# Patient Record
Sex: Male | Born: 1960
Health system: Southern US, Community
[De-identification: ages and names within clinical notes are randomized; demographics above are authoritative.]

## PROBLEM LIST (undated history)

## (undated) DIAGNOSIS — F32A Depression, unspecified: Secondary | ICD-10-CM

## (undated) DIAGNOSIS — I10 Essential (primary) hypertension: Secondary | ICD-10-CM

## (undated) DIAGNOSIS — K219 Gastro-esophageal reflux disease without esophagitis: Secondary | ICD-10-CM

## (undated) HISTORY — PX: COLONOSCOPY: SHX174

## (undated) HISTORY — PX: CHOLECYSTECTOMY: SHX55

## (undated) HISTORY — DX: Gastro-esophageal reflux disease without esophagitis: K21.9

## (undated) HISTORY — PX: UPPER GASTROINTESTINAL ENDOSCOPY: SHX188

## (undated) HISTORY — DX: Depression, unspecified: F32.A

## (undated) HISTORY — DX: Essential (primary) hypertension: I10

---

## 2007-09-21 ENCOUNTER — Emergency Department (HOSPITAL_COMMUNITY): Admission: EM | Admit: 2007-09-21 | Discharge: 2007-09-21 | Payer: Self-pay | Admitting: Emergency Medicine

## 2008-03-24 ENCOUNTER — Encounter (INDEPENDENT_AMBULATORY_CARE_PROVIDER_SITE_OTHER): Payer: Self-pay | Admitting: General Surgery

## 2008-03-24 ENCOUNTER — Ambulatory Visit (HOSPITAL_COMMUNITY): Admission: RE | Admit: 2008-03-24 | Discharge: 2008-03-25 | Payer: Self-pay | Admitting: General Surgery

## 2009-03-10 ENCOUNTER — Emergency Department (HOSPITAL_COMMUNITY): Admission: EM | Admit: 2009-03-10 | Discharge: 2009-03-10 | Payer: Self-pay | Admitting: Infectious Diseases

## 2009-07-09 ENCOUNTER — Encounter (HOSPITAL_COMMUNITY): Admission: RE | Admit: 2009-07-09 | Discharge: 2009-08-08 | Payer: Self-pay | Admitting: Preventative Medicine

## 2010-08-19 LAB — POCT CARDIAC MARKERS
CKMB, poc: 2 ng/mL (ref 1.0–8.0)
Myoglobin, poc: 82.3 ng/mL (ref 12–200)
Troponin i, poc: <0.05 ng/mL (ref 0.00–0.09)
Troponin i, poc: <0.05 ng/mL (ref 0.00–0.09)

## 2010-09-28 NOTE — Op Note (Signed)
Steve Rivera, Steve Rivera             ACCOUNT NO.:  [id number]   MEDICAL RECORD NO.:  [id number]          PATIENT TYPE:  AMB   LOCATION:  DAY                          FACILITY:  [facility]   PHYSICIAN:  Anselm Pancoast. Weatherly, M.D.DATE OF BIRTH:  07/25/1960   DATE OF PROCEDURE:  03/24/2008  DATE OF DISCHARGE:                               OPERATIVE REPORT   PREOPERATIVE DIAGNOSIS:  Biliary dyskinesia, possible chronic  cholecystitis.   POSTOPERATIVE DIAGNOSIS:  Biliary dyskinesia, possible chronic  cholecystitis.   OPERATION:  Laparoscopic cholecystectomy with cholangiogram.   SURGEON:  Anselm Pancoast. Zachery Dakins, M.D.   ASSISTANT:  Almond Lint, MD.   HISTORY:  Mattew Chriswell is a 50 year old thin male who was referred by  Dr. Arletha Grippe of [facility] for symptomatic gallstones.  The patient states he  has had recurrent episodes of epigastric pain.  The pain is made worse  by eating.  He has been evaluated with and upper endoscopy.  Past  history of ulcers, but no evidence of ulcers were demonstrated in the  duodenum on recent endoscopy.  He has had an ultrasound of his  gallbladder which they described as adenomatous polyps within the  gallbladder.  The common bile duct was not dilated and the gallbladder  wall was not thickened.  He has been on Prilosec.  He has had a history  of hypertension for which he is being managed and he was referred to me  for a cholecystectomy.  The patient looks younger than his stated age.  On review of the ultrasound there are small polyps and whether they are  actually polyps or small gallstones, I could not tell.  There was no  evidence of any other abnormalities noted and I recommended that we  proceed on with a cholecystectomy since he has certainly been evaluated  for evidence of peptic ulcers and no evidence of any ulcers identified.  Preoperatively today his CBC is unremarkable; white count 4100 and  hematocrit of 38.5.  Preoperatively he has PAS stockings.  He  was given  a dose of Unasyn and taken to the operative suite.   DESCRIPTION OF PROCEDURE:  Induction of general anesthesia endotracheal  tube, oral tube to the stomach and the abdomen was prepped with Betadine  solution, draped in sterile manner.  The patient's torso is kind of like  a child, he is only 145 pounds.  A small incision after draping was made  below the umbilicus.  The fascia picked up between two Kochers and very  carefully opened into the peritoneal cavity.  The gallbladder was  distended.  After the pursestring suture of O Vicryl and Hassan cannula  introduced and the upper 10 mL trocar was placed in the subxiphoid area  under direct vision after anesthetizing the fascia.  Dr. Donell Beers passed  the two 5 mm lateral trocars at the appropriate position.  The liver was  kind of low lying.  The gallbladder did have adhesions around it.  These  were carefully taken down with the hook electrocautery.  The proximal  portion of the gallbladder was identified.  You could see the cystic  artery and the cystic duct, I went ahead and doubly clipped the cystic  artery proximally, singly distally and divided it to get it out of way,  so I encompassed the cystic duct with a right-angle and a clip placed on  the junction of the cystic duct gallbladder.  A small opening was made  proximally.  Bile comes first and the [facility] catheter was introduced, held  in place with clip.  The x-ray was obtained and showed good prompt fill  of the extrahepatic filling system.  Good flow into the duodenum and no  evidence of any common duct stones.  The catheter was removed.  The  cystic duct was triply clipped and divided and then the gallbladder was  freed from its bed and placed in the EndoCatch bag.  At the completion  of taking the gallbladder down, the exposure was difficult.  It appears  that the patient was kind of getting light and we were losing our  pneumoperitoneum.  There was just a little bit of  bile spillage.  We  irrigated and aspirated, cauterized a little area of the bed where the  most distal portion of the gallbladder was partially intraluminal and  good hemostasis was obtained.  We then switched the camera to the upper  10 mL port, withdrew the gallbladder at the umbilicus within the  EndoCatch bag.  I later opened the gallbladder on the back table and it  was chronically thickened but I could not see any definite stones.  All  contents were sent to pathology for examination.  Next the extra figure-  of-eight of 0 Vicryl was placed in the fascia at the umbilicus and then  we anesthetized fascia at the umbilicus.  The CO2 was turned off after  the remaining irrigating fluid had been removed and the 5 mm ports had  been withdrawn under direct vision.  Carbon dioxide released.  The upper  10 mm trocar was withdrawn.  I did put a figure-of-eight in the fascia  at the subxiphoid so then you could see into the peritoneal cavity.  The  subcutaneous wounds were closed with 4-0 Monocryl.  Benzoin and Steri-  Strips on skin.  We will let the patient decide in recovery room whether  he wants to go home or spend the night.  I think he lives in [place] and  that decision will be his.           ______________________________  Anselm Pancoast. Zachery Dakins, M.D.     WJW/MEDQ  D:  03/24/2008  T:  03/24/2008  Job:  [id number]   cc:   Dr. Arletha Grippe, [place]

## 2010-10-18 IMAGING — CR DG CHEST 2V
2 series · 2 of 2 positions shown · non-contrast
Comparison: Portable chest 09/21/2007.

CLINICAL DATA: Preop respiratory film for patient with gallstones.

CHEST - 2 VIEW

[w chest pa]
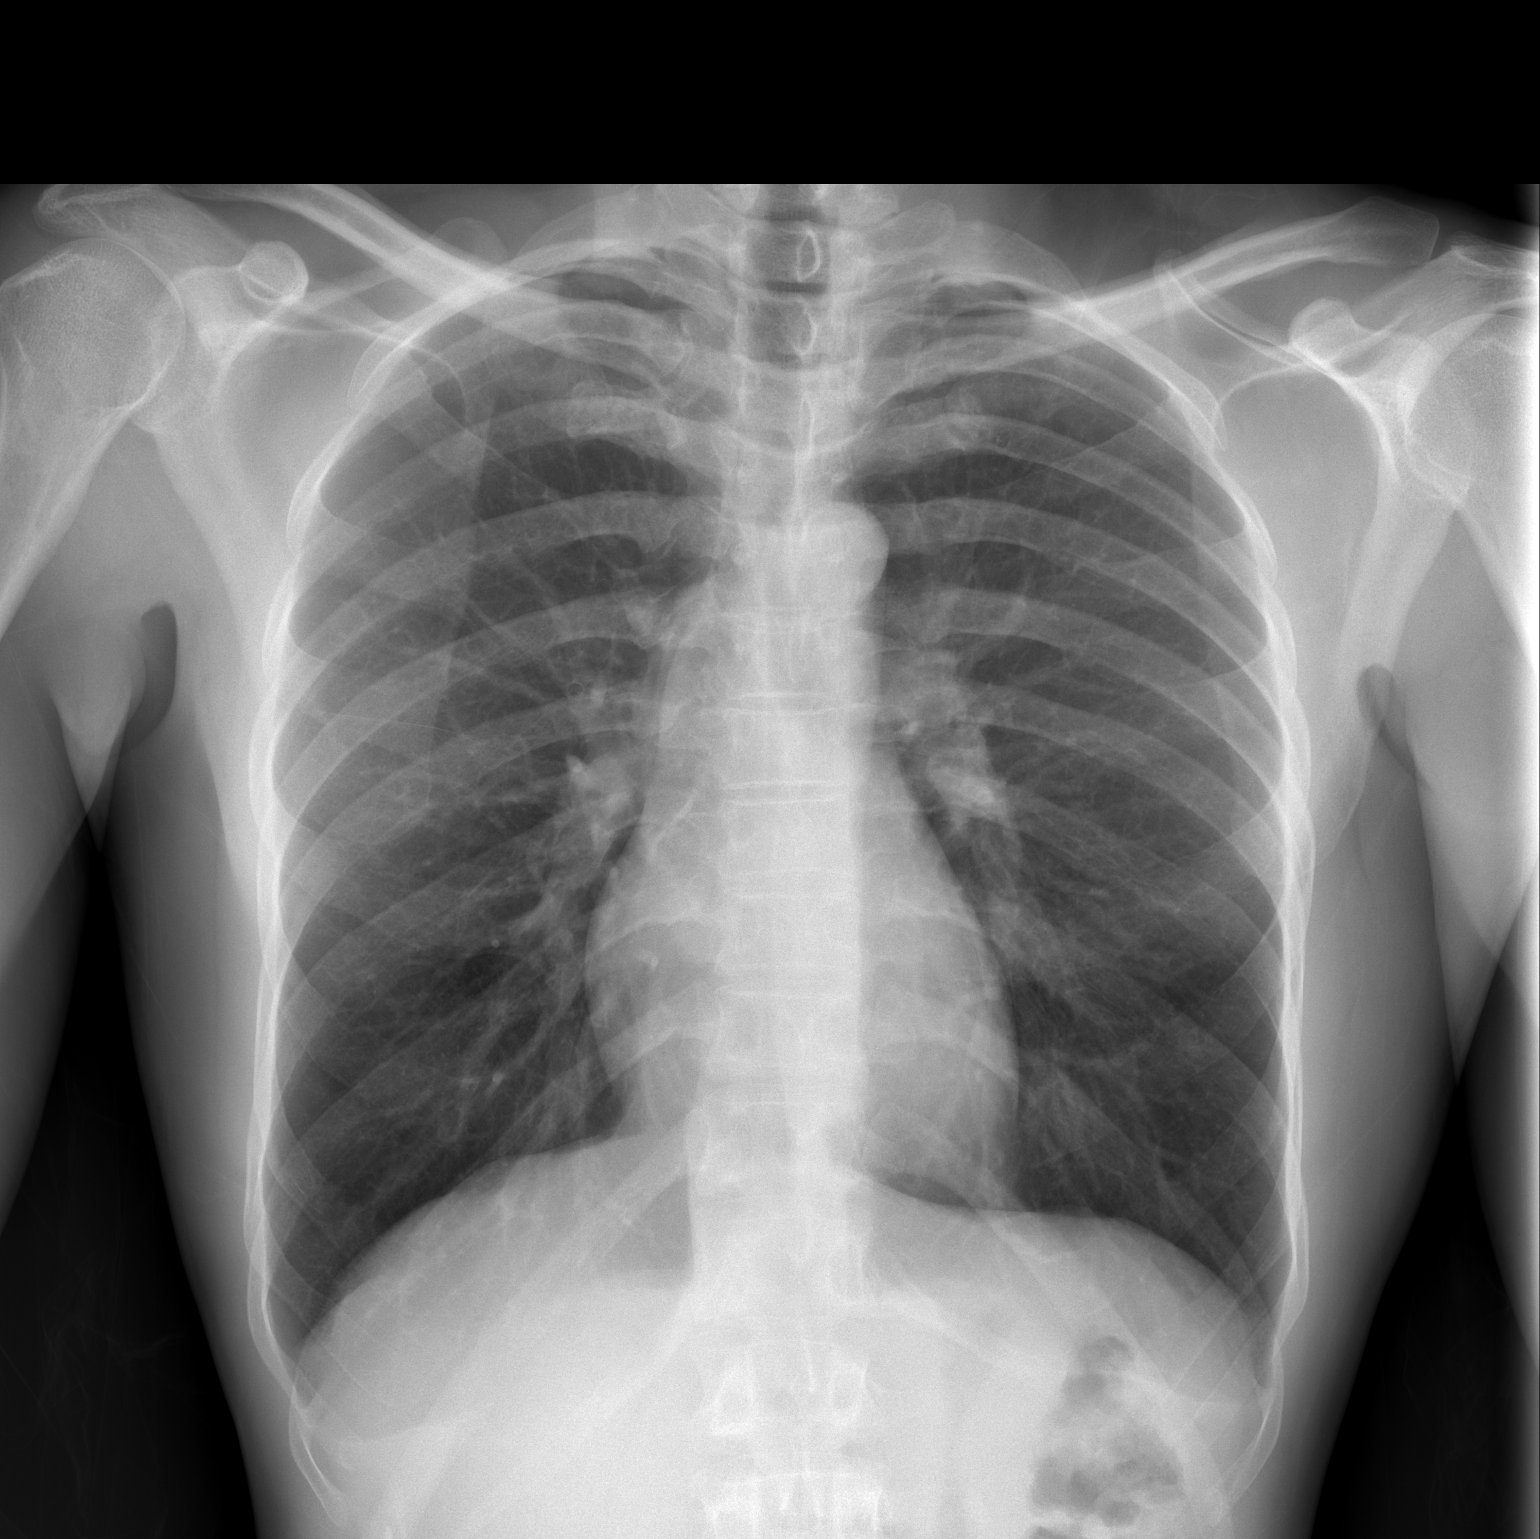

[w chest lat]
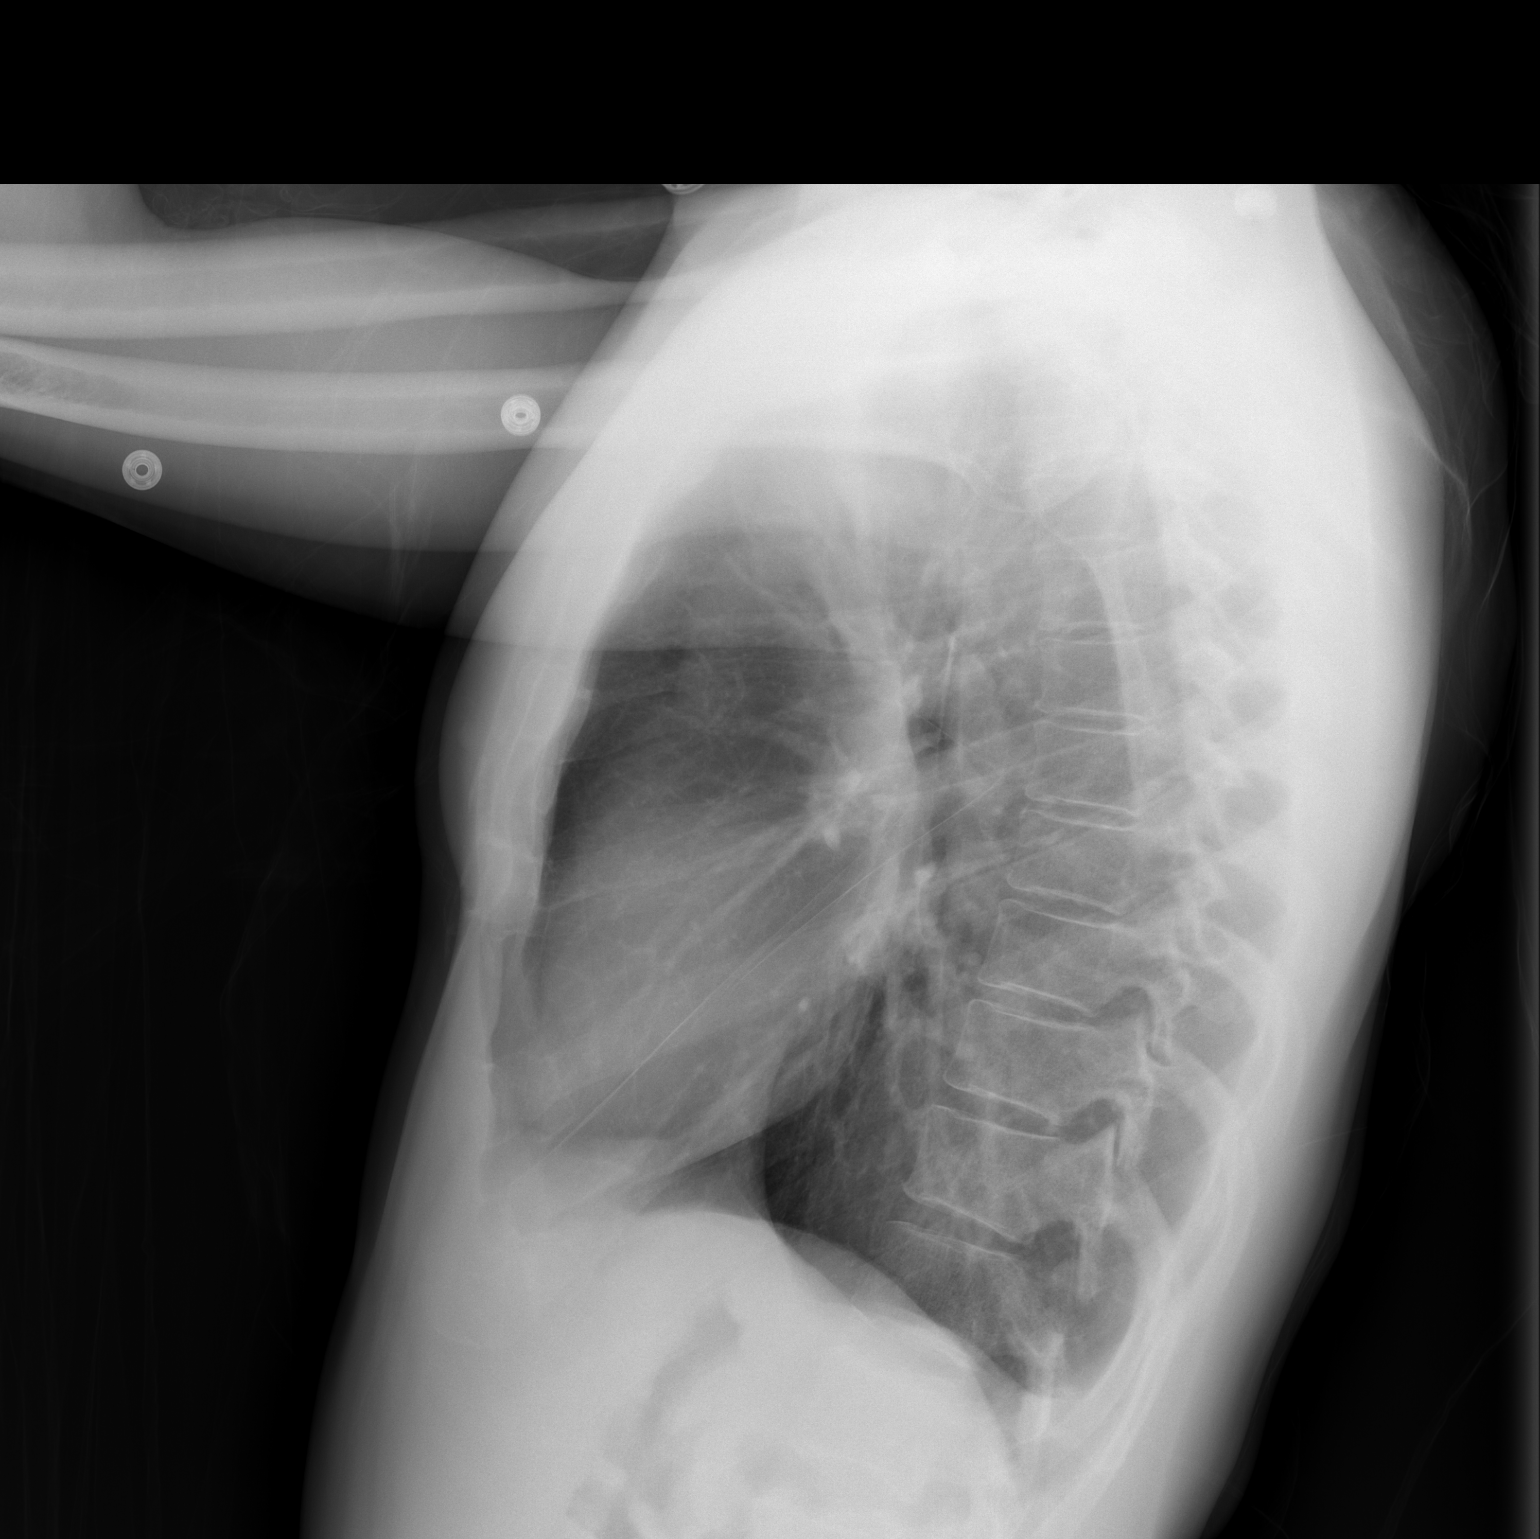

[2 of 2 positions shown; findings below may reference images not displayed]

FINDINGS: Lungs are clear.  Heart size is normal.  No pleural
effusion or focal bony abnormality.
IMPRESSION: No acute disease.

## 2011-02-15 LAB — DIFFERENTIAL
Lymphs Abs: 1.4
Monocytes Relative: 10
Neutro Abs: 2.2

## 2011-02-15 LAB — COMPREHENSIVE METABOLIC PANEL
ALT: 19
Albumin: 3.8
Alkaline Phosphatase: 60
Chloride: 106
GFR calc Af Amer: >60
Glucose, Bld: 91
Potassium: 4
Sodium: 140

## 2011-02-15 LAB — CBC
HCT: 38.5 — ABNORMAL LOW
Hemoglobin: 12.7 — ABNORMAL LOW
MCHC: 32.8
RDW: 12.7
WBC: 4.1

## 2011-10-04 IMAGING — CR DG CHEST 2V
2 series · 2 of 2 positions shown · non-contrast
Comparison: 03/24/2008

CLINICAL DATA: Chest pain

CHEST - 2 VIEW

[view not recorded (1 of 2)]
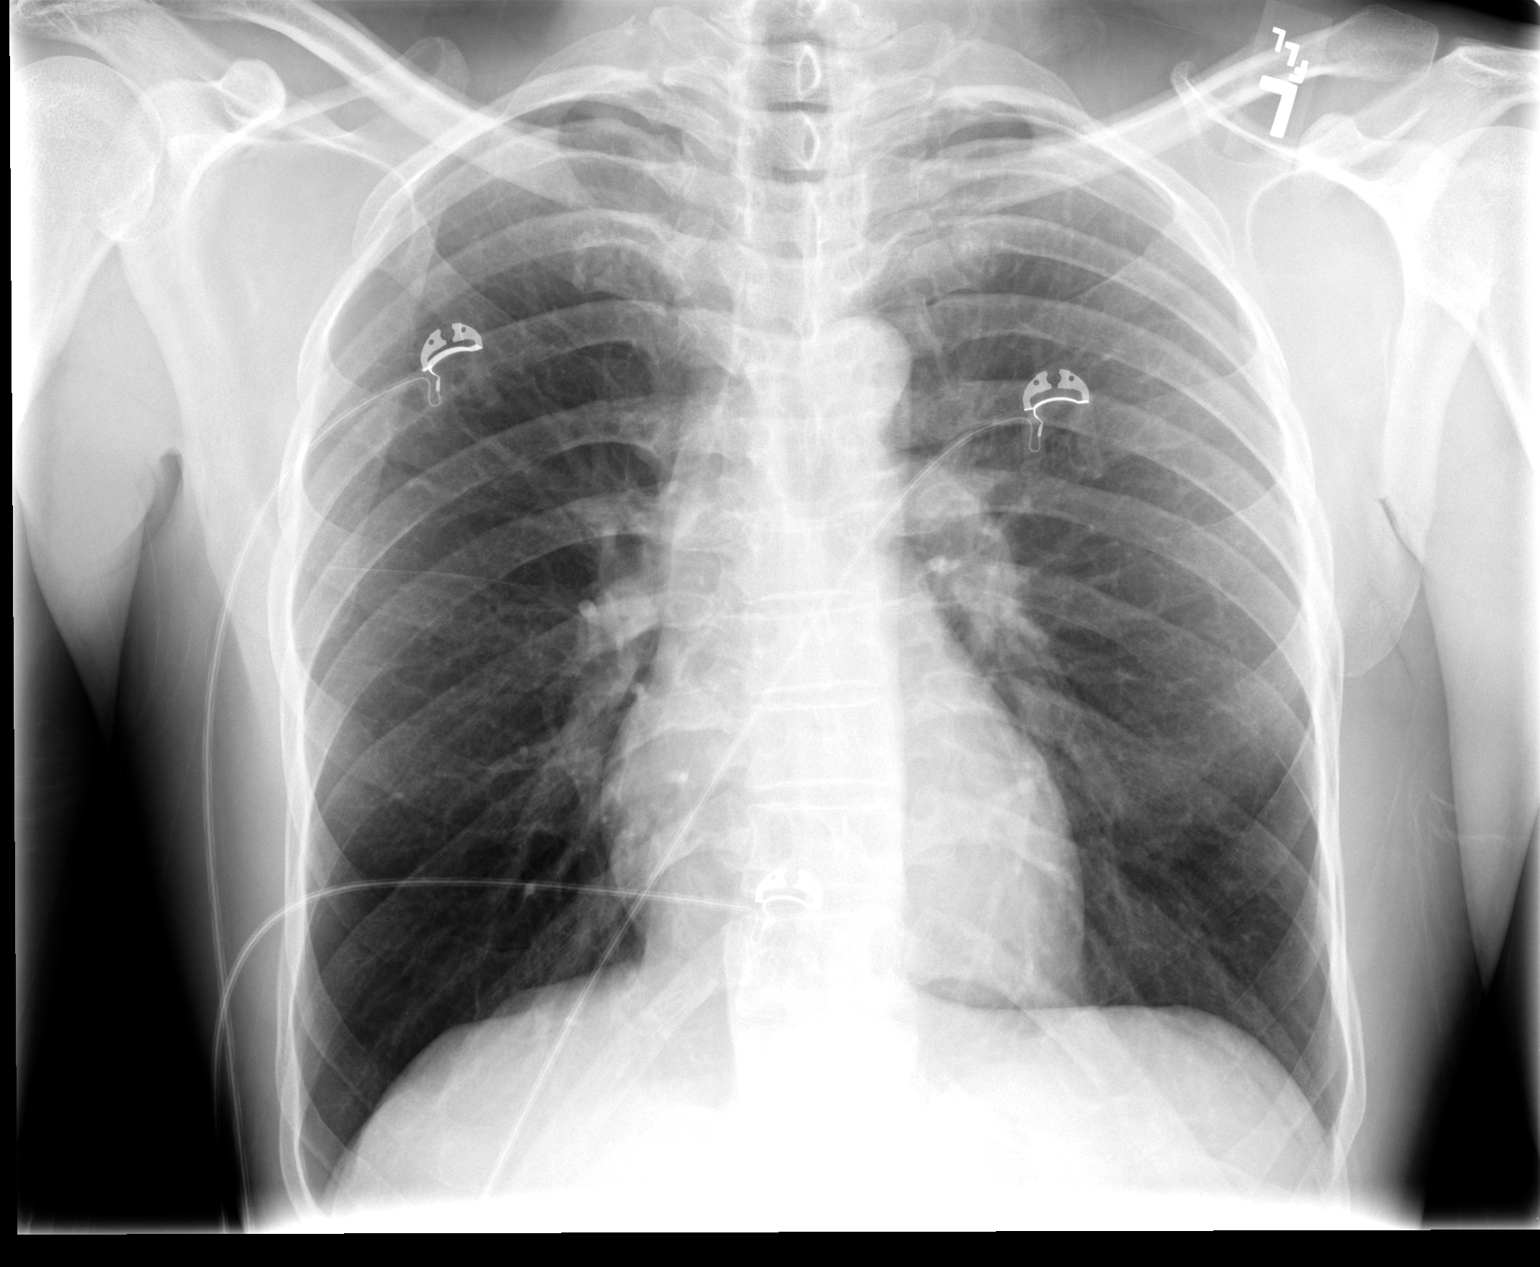

[view not recorded (2 of 2)]
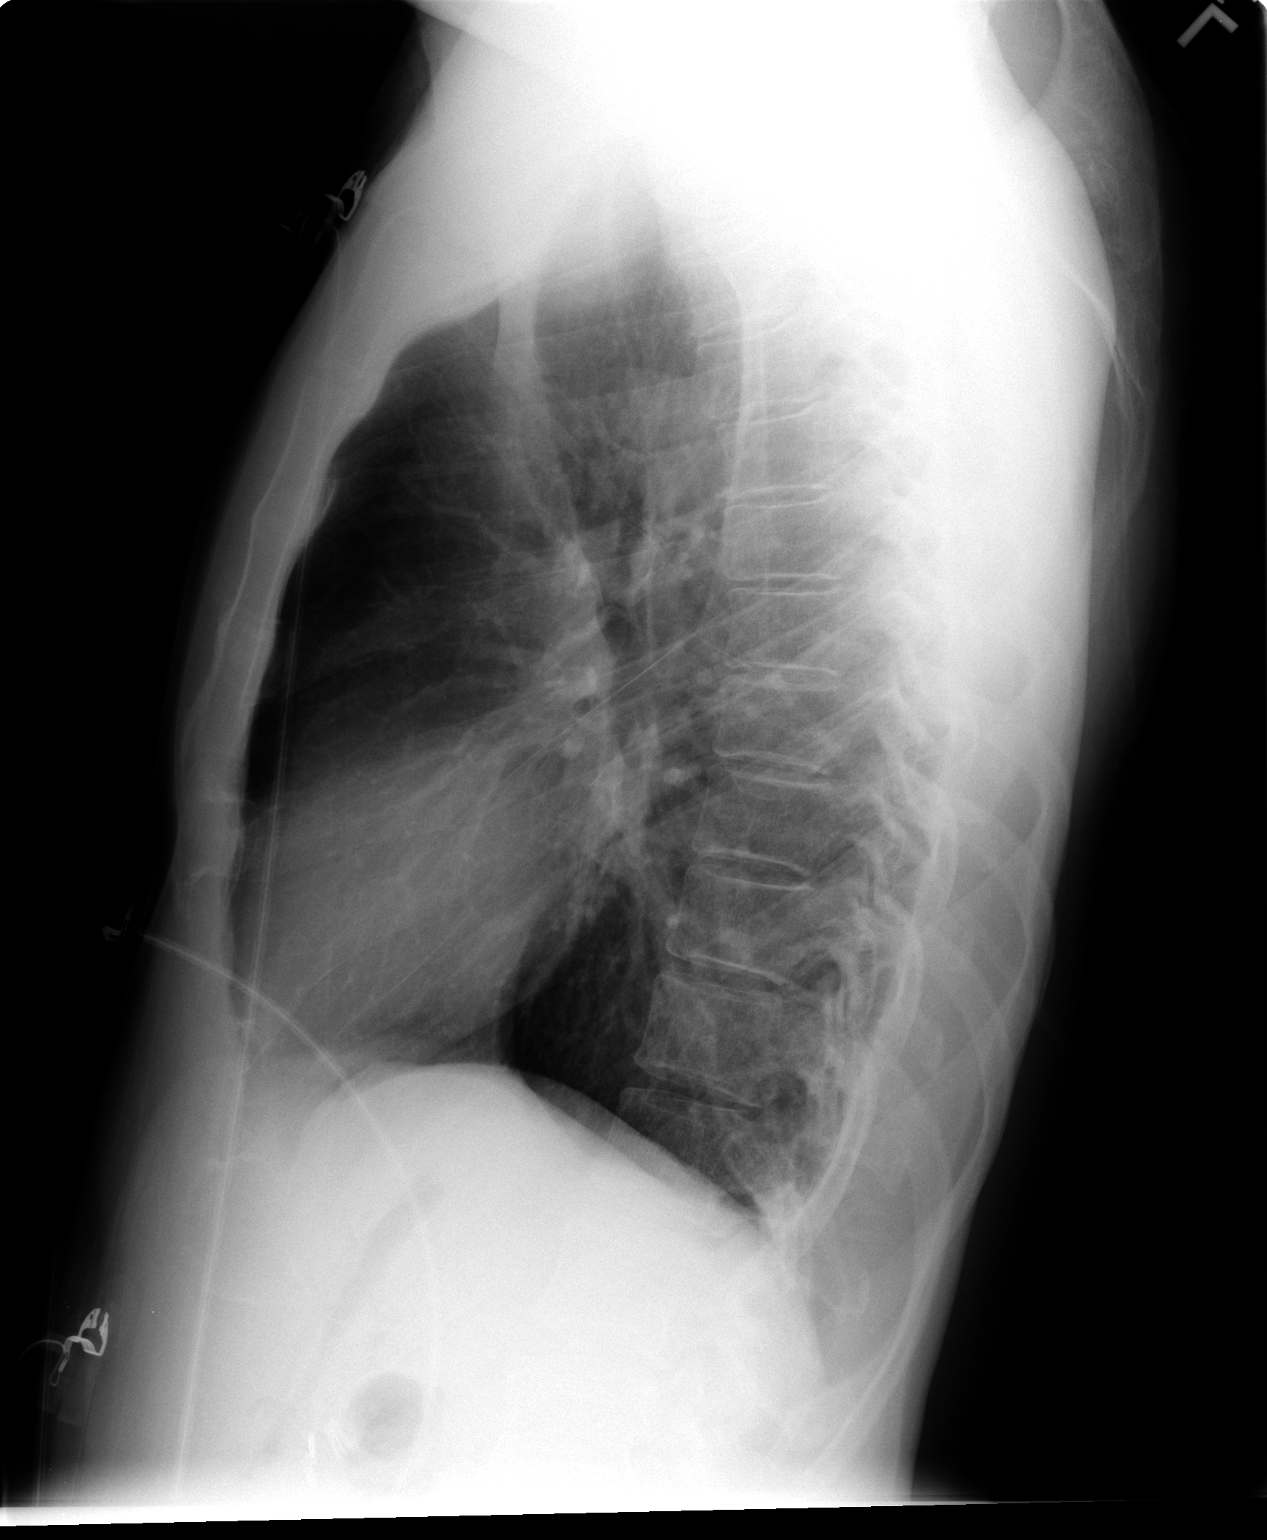

[2 of 2 positions shown; findings below may reference images not displayed]

FINDINGS: Lungs are hyper aerated and clear.  Heart is upper normal
in size.  No pneumothorax.  No pleural effusion.  Intact thoracic
spine.  No obvious acute bony deformity.
IMPRESSION: Hyperaeration.  Otherwise no active cardiopulmonary disease.

## 2015-12-21 ENCOUNTER — Telehealth (HOSPITAL_COMMUNITY): Payer: Self-pay | Admitting: *Deleted

## 2015-12-21 NOTE — Telephone Encounter (Signed)
Left voice message at phone # provided by referring doctor office [phone].  left voice message.

## 2016-06-13 DIAGNOSIS — I10 Essential (primary) hypertension: Secondary | ICD-10-CM | POA: Diagnosis not present

## 2016-06-13 DIAGNOSIS — Z915 Personal history of self-harm: Secondary | ICD-10-CM | POA: Diagnosis not present

## 2016-06-13 DIAGNOSIS — E782 Mixed hyperlipidemia: Secondary | ICD-10-CM | POA: Diagnosis not present

## 2016-06-13 DIAGNOSIS — F329 Major depressive disorder, single episode, unspecified: Secondary | ICD-10-CM | POA: Diagnosis not present

## 2016-06-13 DIAGNOSIS — K219 Gastro-esophageal reflux disease without esophagitis: Secondary | ICD-10-CM | POA: Diagnosis not present

## 2016-06-24 DIAGNOSIS — E782 Mixed hyperlipidemia: Secondary | ICD-10-CM | POA: Diagnosis not present

## 2016-06-24 DIAGNOSIS — K219 Gastro-esophageal reflux disease without esophagitis: Secondary | ICD-10-CM | POA: Diagnosis not present

## 2016-06-24 DIAGNOSIS — I10 Essential (primary) hypertension: Secondary | ICD-10-CM | POA: Diagnosis not present

## 2016-06-24 DIAGNOSIS — F329 Major depressive disorder, single episode, unspecified: Secondary | ICD-10-CM | POA: Diagnosis not present

## 2016-09-12 DIAGNOSIS — J029 Acute pharyngitis, unspecified: Secondary | ICD-10-CM | POA: Diagnosis not present

## 2016-09-12 DIAGNOSIS — Z79899 Other long term (current) drug therapy: Secondary | ICD-10-CM | POA: Diagnosis not present

## 2016-09-12 DIAGNOSIS — Z8249 Family history of ischemic heart disease and other diseases of the circulatory system: Secondary | ICD-10-CM | POA: Diagnosis not present

## 2016-09-12 DIAGNOSIS — F329 Major depressive disorder, single episode, unspecified: Secondary | ICD-10-CM | POA: Diagnosis not present

## 2016-09-12 DIAGNOSIS — I10 Essential (primary) hypertension: Secondary | ICD-10-CM | POA: Diagnosis not present

## 2016-10-13 DIAGNOSIS — F329 Major depressive disorder, single episode, unspecified: Secondary | ICD-10-CM | POA: Diagnosis not present

## 2016-12-05 DIAGNOSIS — Z0000 Encounter for general adult medical examination without abnormal findings: Secondary | ICD-10-CM | POA: Diagnosis not present

## 2016-12-22 DIAGNOSIS — Z681 Body mass index (BMI) 19 or less, adult: Secondary | ICD-10-CM | POA: Diagnosis not present

## 2016-12-22 DIAGNOSIS — Z0000 Encounter for general adult medical examination without abnormal findings: Secondary | ICD-10-CM | POA: Diagnosis not present

## 2016-12-22 DIAGNOSIS — Z23 Encounter for immunization: Secondary | ICD-10-CM | POA: Diagnosis not present

## 2016-12-22 DIAGNOSIS — M19042 Primary osteoarthritis, left hand: Secondary | ICD-10-CM | POA: Diagnosis not present

## 2017-01-31 DIAGNOSIS — F329 Major depressive disorder, single episode, unspecified: Secondary | ICD-10-CM | POA: Diagnosis not present

## 2017-06-15 DIAGNOSIS — F329 Major depressive disorder, single episode, unspecified: Secondary | ICD-10-CM | POA: Diagnosis not present

## 2017-06-30 DIAGNOSIS — I10 Essential (primary) hypertension: Secondary | ICD-10-CM | POA: Diagnosis not present

## 2017-06-30 DIAGNOSIS — E782 Mixed hyperlipidemia: Secondary | ICD-10-CM | POA: Diagnosis not present

## 2017-06-30 DIAGNOSIS — K219 Gastro-esophageal reflux disease without esophagitis: Secondary | ICD-10-CM | POA: Diagnosis not present

## 2017-07-03 DIAGNOSIS — M19041 Primary osteoarthritis, right hand: Secondary | ICD-10-CM | POA: Diagnosis not present

## 2017-07-03 DIAGNOSIS — E782 Mixed hyperlipidemia: Secondary | ICD-10-CM | POA: Diagnosis not present

## 2017-07-03 DIAGNOSIS — M19042 Primary osteoarthritis, left hand: Secondary | ICD-10-CM | POA: Diagnosis not present

## 2017-07-03 DIAGNOSIS — I10 Essential (primary) hypertension: Secondary | ICD-10-CM | POA: Diagnosis not present

## 2017-07-10 DIAGNOSIS — R109 Unspecified abdominal pain: Secondary | ICD-10-CM | POA: Diagnosis not present

## 2017-07-10 DIAGNOSIS — I10 Essential (primary) hypertension: Secondary | ICD-10-CM | POA: Diagnosis not present

## 2017-07-10 DIAGNOSIS — Z79899 Other long term (current) drug therapy: Secondary | ICD-10-CM | POA: Diagnosis not present

## 2017-07-10 DIAGNOSIS — K2900 Acute gastritis without bleeding: Secondary | ICD-10-CM | POA: Diagnosis not present

## 2017-07-10 DIAGNOSIS — K2970 Gastritis, unspecified, without bleeding: Secondary | ICD-10-CM | POA: Diagnosis not present

## 2018-05-29 DIAGNOSIS — Z79899 Other long term (current) drug therapy: Secondary | ICD-10-CM | POA: Diagnosis not present

## 2018-05-29 DIAGNOSIS — J029 Acute pharyngitis, unspecified: Secondary | ICD-10-CM | POA: Diagnosis not present

## 2018-05-29 DIAGNOSIS — R21 Rash and other nonspecific skin eruption: Secondary | ICD-10-CM | POA: Diagnosis not present

## 2018-05-29 DIAGNOSIS — F329 Major depressive disorder, single episode, unspecified: Secondary | ICD-10-CM | POA: Diagnosis not present

## 2018-05-29 DIAGNOSIS — I10 Essential (primary) hypertension: Secondary | ICD-10-CM | POA: Diagnosis not present

## 2018-05-29 DIAGNOSIS — R51 Headache: Secondary | ICD-10-CM | POA: Diagnosis not present

## 2018-07-26 DIAGNOSIS — F329 Major depressive disorder, single episode, unspecified: Secondary | ICD-10-CM | POA: Diagnosis not present

## 2018-09-03 DIAGNOSIS — M19042 Primary osteoarthritis, left hand: Secondary | ICD-10-CM | POA: Diagnosis not present

## 2018-09-03 DIAGNOSIS — Z6822 Body mass index (BMI) 22.0-22.9, adult: Secondary | ICD-10-CM | POA: Diagnosis not present

## 2018-09-03 DIAGNOSIS — Z0000 Encounter for general adult medical examination without abnormal findings: Secondary | ICD-10-CM | POA: Diagnosis not present

## 2018-09-03 DIAGNOSIS — I10 Essential (primary) hypertension: Secondary | ICD-10-CM | POA: Diagnosis not present

## 2018-09-03 DIAGNOSIS — E782 Mixed hyperlipidemia: Secondary | ICD-10-CM | POA: Diagnosis not present

## 2018-09-03 DIAGNOSIS — L282 Other prurigo: Secondary | ICD-10-CM | POA: Diagnosis not present

## 2018-10-03 DIAGNOSIS — Z20828 Contact with and (suspected) exposure to other viral communicable diseases: Secondary | ICD-10-CM | POA: Diagnosis not present

## 2018-10-03 DIAGNOSIS — Z0131 Encounter for examination of blood pressure with abnormal findings: Secondary | ICD-10-CM | POA: Diagnosis not present

## 2018-10-20 DIAGNOSIS — Z79899 Other long term (current) drug therapy: Secondary | ICD-10-CM | POA: Diagnosis not present

## 2018-10-20 DIAGNOSIS — I10 Essential (primary) hypertension: Secondary | ICD-10-CM | POA: Diagnosis not present

## 2018-10-20 DIAGNOSIS — F329 Major depressive disorder, single episode, unspecified: Secondary | ICD-10-CM | POA: Diagnosis not present

## 2018-10-20 DIAGNOSIS — S61211A Laceration without foreign body of left index finger without damage to nail, initial encounter: Secondary | ICD-10-CM | POA: Diagnosis not present

## 2018-10-20 DIAGNOSIS — Z8249 Family history of ischemic heart disease and other diseases of the circulatory system: Secondary | ICD-10-CM | POA: Diagnosis not present

## 2018-10-20 DIAGNOSIS — S80811A Abrasion, right lower leg, initial encounter: Secondary | ICD-10-CM | POA: Diagnosis not present

## 2019-03-07 DIAGNOSIS — F3132 Bipolar disorder, current episode depressed, moderate: Secondary | ICD-10-CM | POA: Diagnosis not present

## 2019-04-23 DIAGNOSIS — F3132 Bipolar disorder, current episode depressed, moderate: Secondary | ICD-10-CM | POA: Diagnosis not present

## 2019-05-06 DIAGNOSIS — F3132 Bipolar disorder, current episode depressed, moderate: Secondary | ICD-10-CM | POA: Diagnosis not present

## 2019-05-24 DIAGNOSIS — F3132 Bipolar disorder, current episode depressed, moderate: Secondary | ICD-10-CM | POA: Diagnosis not present

## 2019-06-08 DIAGNOSIS — J09X2 Influenza due to identified novel influenza A virus with other respiratory manifestations: Secondary | ICD-10-CM | POA: Diagnosis not present

## 2019-06-08 DIAGNOSIS — J029 Acute pharyngitis, unspecified: Secondary | ICD-10-CM | POA: Diagnosis not present

## 2019-06-08 DIAGNOSIS — R05 Cough: Secondary | ICD-10-CM | POA: Diagnosis not present

## 2019-07-10 DIAGNOSIS — F3132 Bipolar disorder, current episode depressed, moderate: Secondary | ICD-10-CM | POA: Diagnosis not present

## 2019-07-26 DIAGNOSIS — F3132 Bipolar disorder, current episode depressed, moderate: Secondary | ICD-10-CM | POA: Diagnosis not present

## 2019-08-12 DIAGNOSIS — F3132 Bipolar disorder, current episode depressed, moderate: Secondary | ICD-10-CM | POA: Diagnosis not present

## 2019-08-28 DIAGNOSIS — F3132 Bipolar disorder, current episode depressed, moderate: Secondary | ICD-10-CM | POA: Diagnosis not present

## 2019-09-16 DIAGNOSIS — F3132 Bipolar disorder, current episode depressed, moderate: Secondary | ICD-10-CM | POA: Diagnosis not present

## 2019-09-19 DIAGNOSIS — F3160 Bipolar disorder, current episode mixed, unspecified: Secondary | ICD-10-CM | POA: Diagnosis not present

## 2019-10-03 DIAGNOSIS — F3160 Bipolar disorder, current episode mixed, unspecified: Secondary | ICD-10-CM | POA: Diagnosis not present

## 2019-10-21 DIAGNOSIS — F3132 Bipolar disorder, current episode depressed, moderate: Secondary | ICD-10-CM | POA: Diagnosis not present

## 2019-10-31 DIAGNOSIS — F3160 Bipolar disorder, current episode mixed, unspecified: Secondary | ICD-10-CM | POA: Diagnosis not present

## 2019-11-18 DIAGNOSIS — F3160 Bipolar disorder, current episode mixed, unspecified: Secondary | ICD-10-CM | POA: Diagnosis not present

## 2019-12-09 DIAGNOSIS — F3132 Bipolar disorder, current episode depressed, moderate: Secondary | ICD-10-CM | POA: Diagnosis not present

## 2020-01-15 DIAGNOSIS — H6123 Impacted cerumen, bilateral: Secondary | ICD-10-CM | POA: Diagnosis not present

## 2020-01-15 DIAGNOSIS — J019 Acute sinusitis, unspecified: Secondary | ICD-10-CM | POA: Diagnosis not present

## 2020-01-15 DIAGNOSIS — Z20822 Contact with and (suspected) exposure to covid-19: Secondary | ICD-10-CM | POA: Diagnosis not present

## 2020-02-06 DIAGNOSIS — M25571 Pain in right ankle and joints of right foot: Secondary | ICD-10-CM | POA: Diagnosis not present

## 2020-02-06 DIAGNOSIS — M25549 Pain in joints of unspecified hand: Secondary | ICD-10-CM | POA: Diagnosis not present

## 2020-02-20 DIAGNOSIS — E782 Mixed hyperlipidemia: Secondary | ICD-10-CM | POA: Diagnosis not present

## 2020-02-20 DIAGNOSIS — Z0000 Encounter for general adult medical examination without abnormal findings: Secondary | ICD-10-CM | POA: Diagnosis not present

## 2020-02-20 DIAGNOSIS — I10 Essential (primary) hypertension: Secondary | ICD-10-CM | POA: Diagnosis not present

## 2020-03-09 DIAGNOSIS — F3132 Bipolar disorder, current episode depressed, moderate: Secondary | ICD-10-CM | POA: Diagnosis not present

## 2020-04-30 DIAGNOSIS — M1389 Other specified arthritis, multiple sites: Secondary | ICD-10-CM | POA: Diagnosis not present

## 2020-04-30 DIAGNOSIS — L03012 Cellulitis of left finger: Secondary | ICD-10-CM | POA: Diagnosis not present

## 2022-04-04 ENCOUNTER — Encounter (INDEPENDENT_AMBULATORY_CARE_PROVIDER_SITE_OTHER): Payer: Self-pay | Admitting: *Deleted

## 2022-06-20 ENCOUNTER — Encounter (INDEPENDENT_AMBULATORY_CARE_PROVIDER_SITE_OTHER): Payer: Self-pay | Admitting: *Deleted

## 2022-06-20 ENCOUNTER — Encounter (INDEPENDENT_AMBULATORY_CARE_PROVIDER_SITE_OTHER): Payer: Self-pay | Admitting: Gastroenterology

## 2022-06-20 ENCOUNTER — Telehealth (INDEPENDENT_AMBULATORY_CARE_PROVIDER_SITE_OTHER): Payer: Self-pay | Admitting: *Deleted

## 2022-06-20 ENCOUNTER — Ambulatory Visit (INDEPENDENT_AMBULATORY_CARE_PROVIDER_SITE_OTHER): Payer: BC Managed Care – PPO | Admitting: Gastroenterology

## 2022-06-20 VITALS — BP 137/72 | HR 73 | Temp 97.8°F | Ht 67.0 in | Wt 174.4 lb

## 2022-06-20 DIAGNOSIS — K529 Noninfective gastroenteritis and colitis, unspecified: Secondary | ICD-10-CM | POA: Insufficient documentation

## 2022-06-20 DIAGNOSIS — R1084 Generalized abdominal pain: Secondary | ICD-10-CM

## 2022-06-20 DIAGNOSIS — R1319 Other dysphagia: Secondary | ICD-10-CM | POA: Diagnosis not present

## 2022-06-20 DIAGNOSIS — R1310 Dysphagia, unspecified: Secondary | ICD-10-CM | POA: Insufficient documentation

## 2022-06-20 DIAGNOSIS — R109 Unspecified abdominal pain: Secondary | ICD-10-CM | POA: Insufficient documentation

## 2022-06-20 NOTE — Progress Notes (Signed)
Steve Rivera, M.D. Gastroenterology & Hepatology [facility] [address] [place],  [address] Primary Care Physician: Practice, [facility] [address]  Referring MD: Karlyn Agee, MD  Chief Complaint: Dysphagia, abdominal pain, odynophagia and diarrhea.  History of Present Illness: Steve Rivera is a 62 y.o. male with Pmh HTN, depression, GERD,  who presents for evaluation of dysphagia, abdominal pain, odynophagia and diarrhea.  Patient reports that for the last 3-4 months he has presented recurrent discomfort in his throat and dysphagia with solids, especially eating chicken or bread. He has had to vomit it a couple of times in the past. Sometimes he can have some odynophagia when food takes too long to go down.  He has had upper abdominal pain frequently in his upper abdomen which he describes as "a sore pain in his upper abdomen".  He was taking some medicine for it but does not remember the name. States that as part of the management of his abdominal pain he underwent a cholecystectomy "several years ago" possibly 2009.  He has also presented chronic diarrhea for 3-4 years, states that he has 4-5 loose Bms per day without mucus or blood. Usually has a BM after eating, but sometimes can have episodes of diarrhea during the night which wakes him up, as well as fecal urgency and fecal soiling. No incontinence.  Overall, he does not specifically know for how long he has presented all the symptoms but he always states " it has been there for a while".  The patient denies having any nausea, vomiting, fever, chills, hematochezia, melena, hematemesis, jaundice, pruritus or weight loss.  Last [id number] Last Colonoscopy:5 years ago, states this was done in [facility] but no reports are available.  FHx: neg for any gastrointestinal/liver disease, no malignancies Social: neg smoking, alcohol or illicit drug  use Surgical: cholecystectomy  Past Medical History: Past Medical History:  Diagnosis Date   Depression    GERD (gastroesophageal reflux disease)    HTN (hypertension)     Past Surgical History: Past Surgical History:  Procedure Laterality Date   CHOLECYSTECTOMY     COLONOSCOPY     UPPER GASTROINTESTINAL ENDOSCOPY      Family History:History reviewed. No pertinent family history.  Social History: Social History   Tobacco Use  Smoking Status Never  Smokeless Tobacco Never   Social History   Substance and Sexual Activity  Alcohol Use Never   Social History   Substance and Sexual Activity  Drug Use Never    Allergies: No Known Allergies  Medications: Current Outpatient Medications  Medication Sig Dispense Refill   amLODipine (NORVASC) 5 MG tablet Take 5 mg by mouth daily.     busPIRone (BUSPAR) 10 MG tablet Take 10 mg by mouth 2 (two) times daily.     citalopram (CELEXA) 40 MG tablet Take 40 mg by mouth daily.     No current facility-administered medications for this visit.    Review of Systems: GENERAL: negative for malaise, night sweats HEENT: No changes in hearing or vision, no nose bleeds or other nasal problems. NECK: Negative for lumps, goiter, pain and significant neck swelling RESPIRATORY: Negative for cough, wheezing CARDIOVASCULAR: Negative for chest pain, leg swelling, palpitations, orthopnea GI: SEE HPI MUSCULOSKELETAL: Negative for joint pain or swelling, back pain, and muscle pain. SKIN: Negative for lesions, rash PSYCH: Negative for sleep disturbance, mood disorder and recent psychosocial stressors. HEMATOLOGY Negative for prolonged bleeding, bruising easily, and swollen  nodes. ENDOCRINE: Negative for cold or heat intolerance, polyuria, polydipsia and goiter. NEURO: negative for tremor, gait imbalance, syncope and seizures. The remainder of the review of systems is noncontributory.   Physical Exam: BP 137/72 (BP Location: Right Arm,  Patient Position: Sitting, Cuff Size: Large)   Pulse 73   Temp 97.8 F (36.6 C) (Temporal)   Ht 5' 7"$  (1.702 m)   Wt 174 lb 6.4 oz (79.1 kg)   BMI 27.31 kg/m  GENERAL: The patient is AO x3, in no acute distress. HEENT: Head is normocephalic and atraumatic. EOMI are intact. Mouth is well hydrated and without lesions. NECK: Supple. No masses LUNGS: Clear to auscultation. No presence of rhonchi/wheezing/rales. Adequate chest expansion HEART: RRR, normal s1 and s2. ABDOMEN: tender, no guarding, no peritoneal signs, and nondistended. BS +. No masses. EXTREMITIES: Without any cyanosis, clubbing, rash, lesions or edema. NEUROLOGIC: AOx3, no focal motor deficit. SKIN: no jaundice, no rashes  Imaging/Labs: as above  I personally reviewed and interpreted the available labs, imaging and endoscopic files.  Impression and Plan: Steve Rivera is a 62 y.o. male with Pmh HTN, depression, GERD,  who presents for evaluation of dysphagia, abdominal pain, odynophagia and diarrhea.  Patient has presented chronic intermittent symptoms without red flag signs.  It is unclear the timeframe for his symptom presentation but it seems that his symptoms have aggravated more recently.  We discussed that we should ideally evaluate this further with an EGD with possible dilation and a colonoscopy, which he is agreeable to proceed with.  Depending on the findings, may consider starting a PPI and initial treatment.  Can also use Imodium as needed to improve diarrhea.  -Schedule EGD and colonoscopy -Can use Imodium as needed for diarrhea or to avoid accidents when going out of home -May consider starting PPI daily depending on endoscopic findings  All questions were answered.      Steve Peppers, MD Gastroenterology and Hepatology [facility]

## 2022-06-20 NOTE — Patient Instructions (Addendum)
Schedule EGD and colonoscopy Can use Imodium as needed for diarrhea or to avoid accidents when going out of home

## 2022-06-20 NOTE — H&P (View-Only) (Signed)
Maylon Peppers, M.D. Gastroenterology & Hepatology [facility] [address] [place], [place] [address] Primary Care Physician: Practice, [facility] [address]  Referring MD: Karlyn Agee, MD  Chief Complaint: Dysphagia, abdominal pain, odynophagia and diarrhea.  History of Present Illness: Steve Rivera is a 62 y.o. male with Pmh HTN, depression, GERD,  who presents for evaluation of dysphagia, abdominal pain, odynophagia and diarrhea.  Patient reports that for the last 3-4 months he has presented recurrent discomfort in his throat and dysphagia with solids, especially eating chicken or bread. He has had to vomit it a couple of times in the past. Sometimes he can have some odynophagia when food takes too long to go down.  He has had upper abdominal pain frequently in his upper abdomen which he describes as "a sore pain in his upper abdomen".  He was taking some medicine for it but does not remember the name. States that as part of the management of his abdominal pain he underwent a cholecystectomy "several years ago" possibly 2009.  He has also presented chronic diarrhea for 3-4 years, states that he has 4-5 loose Bms per day without mucus or blood. Usually has a BM after eating, but sometimes can have episodes of diarrhea during the night which wakes him up, as well as fecal urgency and fecal soiling. No incontinence.  Overall, he does not specifically know for how long he has presented all the symptoms but he always states " it has been there for a while".  The patient denies having any nausea, vomiting, fever, chills, hematochezia, melena, hematemesis, jaundice, pruritus or weight loss.  Last [id number] Last Colonoscopy:5 years ago, states this was done in [facility] but no reports are available.  FHx: neg for any gastrointestinal/liver disease, no malignancies Social: neg smoking, alcohol or illicit drug  use Surgical: cholecystectomy  Past Medical History: Past Medical History:  Diagnosis Date   Depression    GERD (gastroesophageal reflux disease)    HTN (hypertension)     Past Surgical History: Past Surgical History:  Procedure Laterality Date   CHOLECYSTECTOMY     COLONOSCOPY     UPPER GASTROINTESTINAL ENDOSCOPY      Family History:History reviewed. No pertinent family history.  Social History: Social History   Tobacco Use  Smoking Status Never  Smokeless Tobacco Never   Social History   Substance and Sexual Activity  Alcohol Use Never   Social History   Substance and Sexual Activity  Drug Use Never    Allergies: No Known Allergies  Medications: Current Outpatient Medications  Medication Sig Dispense Refill   amLODipine (NORVASC) 5 MG tablet Take 5 mg by mouth daily.     busPIRone (BUSPAR) 10 MG tablet Take 10 mg by mouth 2 (two) times daily.     citalopram (CELEXA) 40 MG tablet Take 40 mg by mouth daily.     No current facility-administered medications for this visit.    Review of Systems: GENERAL: negative for malaise, night sweats HEENT: No changes in hearing or vision, no nose bleeds or other nasal problems. NECK: Negative for lumps, goiter, pain and significant neck swelling RESPIRATORY: Negative for cough, wheezing CARDIOVASCULAR: Negative for chest pain, leg swelling, palpitations, orthopnea GI: SEE HPI MUSCULOSKELETAL: Negative for joint pain or swelling, back pain, and muscle pain. SKIN: Negative for lesions, rash PSYCH: Negative for sleep disturbance, mood disorder and recent psychosocial stressors. HEMATOLOGY Negative for prolonged bleeding, bruising easily, and swollen  nodes. ENDOCRINE: Negative for cold or heat intolerance, polyuria, polydipsia and goiter. NEURO: negative for tremor, gait imbalance, syncope and seizures. The remainder of the review of systems is noncontributory.   Physical Exam: BP 137/72 (BP Location: Right Arm,  Patient Position: Sitting, Cuff Size: Large)   Pulse 73   Temp 97.8 F (36.6 C) (Temporal)   Ht 5' 7"$  (1.702 m)   Wt 174 lb 6.4 oz (79.1 kg)   BMI 27.31 kg/m  GENERAL: The patient is AO x3, in no acute distress. HEENT: Head is normocephalic and atraumatic. EOMI are intact. Mouth is well hydrated and without lesions. NECK: Supple. No masses LUNGS: Clear to auscultation. No presence of rhonchi/wheezing/rales. Adequate chest expansion HEART: RRR, normal s1 and s2. ABDOMEN: tender, no guarding, no peritoneal signs, and nondistended. BS +. No masses. EXTREMITIES: Without any cyanosis, clubbing, rash, lesions or edema. NEUROLOGIC: AOx3, no focal motor deficit. SKIN: no jaundice, no rashes  Imaging/Labs: as above  I personally reviewed and interpreted the available labs, imaging and endoscopic files.  Impression and Plan: Steve Rivera is a 62 y.o. male with Pmh HTN, depression, GERD,  who presents for evaluation of dysphagia, abdominal pain, odynophagia and diarrhea.  Patient has presented chronic intermittent symptoms without red flag signs.  It is unclear the timeframe for his symptom presentation but it seems that his symptoms have aggravated more recently.  We discussed that we should ideally evaluate this further with an EGD with possible dilation and a colonoscopy, which he is agreeable to proceed with.  Depending on the findings, may consider starting a PPI and initial treatment.  Can also use Imodium as needed to improve diarrhea.  -Schedule EGD and colonoscopy -Can use Imodium as needed for diarrhea or to avoid accidents when going out of home -May consider starting PPI daily depending on endoscopic findings  All questions were answered.      Maylon Peppers, MD Gastroenterology and Hepatology [facility]

## 2022-06-20 NOTE — Telephone Encounter (Signed)
PA for EGD/ED approved via [place]. Order ID: [id number], Approval Valid Through: 06/20/2022 - 08/18/2022  Per Colonoscopy code "The member does not have Railroad coverage. The procedure cannot be added."

## 2022-06-23 NOTE — Addendum Note (Signed)
Addended by: CASTANEDA MAYORGA, Bradon Fester on: 06/23/2022 03:08 PM   Modules accepted: Level of Service  

## 2022-07-01 ENCOUNTER — Ambulatory Visit (HOSPITAL_COMMUNITY): Payer: BC Managed Care – PPO | Admitting: Certified Registered"

## 2022-07-01 ENCOUNTER — Encounter (HOSPITAL_COMMUNITY)
Admission: RE | Disposition: A | Discharge status: Home / Self Care | Payer: Self-pay | Source: Home / Self Care | Attending: Gastroenterology

## 2022-07-01 ENCOUNTER — Ambulatory Visit (HOSPITAL_COMMUNITY)
Admission: RE | Admit: 2022-07-01 | Discharge: 2022-07-01 | Disposition: A | Discharge status: Home / Self Care | Payer: BC Managed Care – PPO | Attending: Gastroenterology | Admitting: Gastroenterology

## 2022-07-01 ENCOUNTER — Other Ambulatory Visit: Payer: Self-pay

## 2022-07-01 ENCOUNTER — Encounter (HOSPITAL_COMMUNITY): Payer: Self-pay | Admitting: Gastroenterology

## 2022-07-01 DIAGNOSIS — R1013 Epigastric pain: Secondary | ICD-10-CM | POA: Diagnosis not present

## 2022-07-01 DIAGNOSIS — R1310 Dysphagia, unspecified: Secondary | ICD-10-CM | POA: Insufficient documentation

## 2022-07-01 DIAGNOSIS — K219 Gastro-esophageal reflux disease without esophagitis: Secondary | ICD-10-CM | POA: Diagnosis not present

## 2022-07-01 DIAGNOSIS — R197 Diarrhea, unspecified: Secondary | ICD-10-CM | POA: Insufficient documentation

## 2022-07-01 DIAGNOSIS — K319 Disease of stomach and duodenum, unspecified: Secondary | ICD-10-CM | POA: Insufficient documentation

## 2022-07-01 DIAGNOSIS — Z9049 Acquired absence of other specified parts of digestive tract: Secondary | ICD-10-CM | POA: Insufficient documentation

## 2022-07-01 DIAGNOSIS — F32A Depression, unspecified: Secondary | ICD-10-CM | POA: Insufficient documentation

## 2022-07-01 DIAGNOSIS — R1319 Other dysphagia: Secondary | ICD-10-CM | POA: Diagnosis not present

## 2022-07-01 DIAGNOSIS — K529 Noninfective gastroenteritis and colitis, unspecified: Secondary | ICD-10-CM | POA: Diagnosis not present

## 2022-07-01 DIAGNOSIS — I10 Essential (primary) hypertension: Secondary | ICD-10-CM | POA: Diagnosis not present

## 2022-07-01 DIAGNOSIS — K2970 Gastritis, unspecified, without bleeding: Secondary | ICD-10-CM | POA: Diagnosis not present

## 2022-07-01 HISTORY — PX: COLONOSCOPY WITH PROPOFOL: SHX5780

## 2022-07-01 HISTORY — PX: ESOPHAGOGASTRODUODENOSCOPY (EGD) WITH PROPOFOL: SHX5813

## 2022-07-01 HISTORY — PX: ESOPHAGEAL DILATION: SHX303

## 2022-07-01 LAB — HM COLONOSCOPY

## 2022-07-01 SURGERY — COLONOSCOPY WITH PROPOFOL
Anesthesia: General

## 2022-07-01 MED ORDER — LIDOCAINE HCL (CARDIAC) PF 100 MG/5ML IV SOSY
PREFILLED_SYRINGE | INTRAVENOUS | Status: DC | PRN
  Administered 2022-07-01: 50 mg via INTRAVENOUS

## 2022-07-01 MED ORDER — PHENYLEPHRINE 80 MCG/ML (10ML) SYRINGE FOR IV PUSH (FOR BLOOD PRESSURE SUPPORT)
PREFILLED_SYRINGE | INTRAVENOUS | Status: DC | PRN
  Administered 2022-07-01: 100 ug via INTRAVENOUS

## 2022-07-01 MED ORDER — PROPOFOL 500 MG/50ML IV EMUL
INTRAVENOUS | Status: DC | PRN
  Administered 2022-07-01: 100 ug/kg/min via INTRAVENOUS

## 2022-07-01 MED ORDER — PROPOFOL 10 MG/ML IV BOLUS
INTRAVENOUS | Status: DC | PRN
  Administered 2022-07-01: 50 mg via INTRAVENOUS
  Administered 2022-07-01: 100 mg via INTRAVENOUS
  Administered 2022-07-01: 50 mg via INTRAVENOUS

## 2022-07-01 MED ORDER — OMEPRAZOLE 40 MG PO CPDR: 40.0000 mg | DELAYED_RELEASE_CAPSULE | Freq: Every day | ORAL | 3 refills | Status: AC

## 2022-07-01 MED ORDER — LACTATED RINGERS IV SOLN
INTRAVENOUS | Status: DC
  Administered 2022-07-01: 1000 mL via INTRAVENOUS

## 2022-07-01 NOTE — Interval H&P Note (Signed)
History and Physical Interval Note:  07/01/2022 12:04 PM  Steve Rivera  has presented today for surgery, with the diagnosis of dysphagia, chronic diarrhea, abd pain.  The various methods of treatment have been discussed with the patient and family. After consideration of risks, benefits and other options for treatment, the patient has consented to  Procedure(s) with comments: COLONOSCOPY WITH PROPOFOL (N/A) - 130pm, asa 1-2 ESOPHAGOGASTRODUODENOSCOPY (EGD) WITH PROPOFOL (N/A) ESOPHAGEAL DILATION (N/A) as a surgical intervention.  The patient's history has been reviewed, patient examined, no change in status, stable for surgery.  I have reviewed the patient's chart and labs.  Questions were answered to the patient's satisfaction.     [name]

## 2022-07-01 NOTE — Op Note (Signed)
[facility] Patient Name: Steve Rivera Procedure Date: 07/01/2022 12:10 PM MRN: [id number] Date of Birth: 11/05/1960 Attending MD: Maylon Peppers , , [id number] CSN: [id number] Age: 62 Admit Type: Outpatient Procedure:                Upper GI endoscopy Indications:              Epigastric abdominal pain, Dysphagia, Follow-up of                            gastro-esophageal reflux disease Providers:                Maylon Peppers, Lurline Del, RN, Ladoris Gene                            Technician, Technician Referring MD:              Medicines:                Monitored Anesthesia Care Complications:            No immediate complications. Estimated Blood Loss:     Estimated blood loss: none. Procedure:                Pre-Anesthesia Assessment:                           - Prior to the procedure, a History and Physical                            was performed, and patient medications, allergies                            and sensitivities were reviewed. The patient's                            tolerance of previous anesthesia was reviewed.                           - The risks and benefits of the procedure and the                            sedation options and risks were discussed with the                            patient. All questions were answered and informed                            consent was obtained.                           - ASA Grade Assessment: II - A patient with mild                            systemic disease.                           After obtaining informed consent, the endoscope was  passed under direct vision. Throughout the                            procedure, the patient's blood pressure, pulse, and                            oxygen saturations were monitored continuously. The                            [id number]) scope was introduced through the                            mouth, and advanced to the second part of  duodenum.                            The upper GI endoscopy was accomplished without                            difficulty. The patient tolerated the procedure                            well. Scope In: 12:28:26 PM Scope Out: 12:37:17 PM Total Procedure Duration: 0 hours 8 minutes 51 seconds  Findings:      No endoscopic abnormality was evident in the esophagus to explain the       patient's complaint of dysphagia. It was decided, however, to proceed       with dilation of the entire esophagus. A guidewire was placed and the       scope was withdrawn. Dilation was performed with a Savary dilator with       no resistance at 18 mm. The dilation site was examined following       endoscope reinsertion and showed no change.      Patchy moderate inflammation characterized by erythema was found in the       gastric antrum. Biopsies were taken with a cold forceps for Helicobacter       pylori testing.      The examined duodenum was normal. Impression:               - No endoscopic esophageal abnormality to explain                            patient's dysphagia. Esophagus dilated. Dilated.                           - Gastritis. Biopsied.                           - Normal examined duodenum. Moderate Sedation:      Per Anesthesia Care Recommendation:           - Discharge patient to home (ambulatory).                           - Resume previous diet.                           -  Await pathology results.                           - Use Prilosec (omeprazole) 40 mg PO daily. Procedure Code(s):        --- Professional ---                           [phone], Esophagogastroduodenoscopy, flexible,                            transoral; with insertion of guide wire followed by                            passage of dilator(s) through esophagus over guide                            wire                           43239, 53, Esophagogastroduodenoscopy, flexible,                            transoral; with  biopsy, single or multiple Diagnosis Code(s):        --- Professional ---                           R13.10, Dysphagia, unspecified                           K29.70, Gastritis, unspecified, without bleeding                           R10.13, Epigastric pain                           K21.9, Gastro-esophageal reflux disease without                            esophagitis CPT copyright 2022 American Medical Association. All rights reserved. The codes documented in this report are preliminary and upon coder review may  be revised to meet current compliance requirements. Maylon Peppers, MD Maylon Peppers,  07/01/2022 12:42:11 PM This report has been signed electronically. Number of Addenda: 0

## 2022-07-01 NOTE — Transfer of Care (Signed)
Immediate Anesthesia Transfer of Care Note  Patient: Steve Rivera  Procedure(s) Performed: COLONOSCOPY WITH PROPOFOL ESOPHAGOGASTRODUODENOSCOPY (EGD) WITH PROPOFOL ESOPHAGEAL DILATION  Patient Location: Endoscopy Unit  Anesthesia Type:General  Level of Consciousness: drowsy  Airway & Oxygen Therapy: Patient Spontanous Breathing  Post-op Assessment: Report given to RN and Post -op Vital signs reviewed and stable  Post vital signs: Reviewed and stable  Last Vitals:  Vitals Value Taken Time  BP    Temp    Pulse    Resp    SpO2      Last Pain:  Vitals:   07/01/22 1228  TempSrc:   PainSc: 7       Patients Stated Pain Goal: 7 ([id number])  Complications: No notable events documented.

## 2022-07-01 NOTE — Anesthesia Preprocedure Evaluation (Signed)
Anesthesia Evaluation  Patient identified by MRN, date of birth, ID band Patient awake    Reviewed: Allergy & Precautions, H&P , NPO status , Patient's Chart, lab work & pertinent test results, reviewed documented beta blocker date and time   Airway Mallampati: II  TM Distance: >3 FB Neck ROM: full    Dental no notable dental hx.    Pulmonary neg pulmonary ROS   Pulmonary exam normal breath sounds clear to auscultation       Cardiovascular Exercise Tolerance: Good hypertension, negative cardio ROS  Rhythm:regular Rate:Normal     Neuro/Psych  PSYCHIATRIC DISORDERS  Depression    negative neurological ROS  negative psych ROS   GI/Hepatic negative GI ROS, Neg liver ROS,GERD  ,,  Endo/Other  negative endocrine ROS    Renal/GU negative Renal ROS  negative genitourinary   Musculoskeletal   Abdominal   Peds  Hematology negative hematology ROS (+)   Anesthesia Other Findings   Reproductive/Obstetrics negative OB ROS                             Anesthesia Physical Anesthesia Plan  ASA: 2  Anesthesia Plan: General   Post-op Pain Management:    Induction:   PONV Risk Score and Plan: Propofol infusion  Airway Management Planned:   Additional Equipment:   Intra-op Plan:   Post-operative Plan:   Informed Consent: I have reviewed the patients History and Physical, chart, labs and discussed the procedure including the risks, benefits and alternatives for the proposed anesthesia with the patient or authorized representative who has indicated his/her understanding and acceptance.     Dental Advisory Given  Plan Discussed with: CRNA  Anesthesia Plan Comments:        Anesthesia Quick Evaluation

## 2022-07-01 NOTE — Discharge Instructions (Addendum)
You are being discharged to home.  Resume your previous diet.  We are waiting for your pathology results.  Take Prilosec (omeprazole) 40 mg by mouth once a day. Your physician has recommended a repeat colonoscopy in 10 years for screening purposes.  Take Imodium as needed if more than 4 bowel movements per day

## 2022-07-01 NOTE — Op Note (Signed)
[facility] [place] Patient Name: Steve Rivera Procedure Date: 07/01/2022 12:02 PM MRN: [id number] Date of Birth: 1960-10-18 Attending MD: Maylon Peppers , , [id number] CSN: [id number] Age: 62 Admit Type: Outpatient Procedure:                Colonoscopy Indications:              Clinically significant diarrhea of unexplained                            origin Providers:                Maylon Peppers, Lurline Del, RN, Ladoris Gene                            Technician, Technician Referring MD:              Medicines:                Monitored Anesthesia Care Complications:            No immediate complications. Estimated Blood Loss:     Estimated blood loss: none. Procedure:                Pre-Anesthesia Assessment:                           - Prior to the procedure, a History and Physical                            was performed, and patient medications, allergies                            and sensitivities were reviewed. The patient's                            tolerance of previous anesthesia was reviewed.                           - The risks and benefits of the procedure and the                            sedation options and risks were discussed with the                            patient. All questions were answered and informed                            consent was obtained.                           - ASA Grade Assessment: II - A patient with mild                            systemic disease.                           After obtaining informed consent, the colonoscope  was passed under direct vision. Throughout the                            procedure, the patient's blood pressure, pulse, and                            oxygen saturations were monitored continuously. The                            [id number]) scope was introduced through                            the anus and advanced to the the cecum, identified                             by appendiceal orifice and ileocecal valve. The                            colonoscopy was performed without difficulty. The                            patient tolerated the procedure well. The quality                            of the bowel preparation was excellent. Scope In: 12:43:20 PM Scope Out: 12:55:08 PM Scope Withdrawal Time: 0 hours 10 minutes 10 seconds  Total Procedure Duration: 0 hours 11 minutes 48 seconds  Findings:      The perianal and digital rectal examinations were normal.      The colon (entire examined portion) appeared normal. Biopsies for       histology were taken with a cold forceps from the right colon and left       colon for evaluation of microscopic colitis.      The retroflexed view of the distal rectum and anal verge was normal and       showed no anal or rectal abnormalities. Impression:               - The entire examined colon is normal. Biopsied.                           - The distal rectum and anal verge are normal on                            retroflexion view. Moderate Sedation:      Per Anesthesia Care Recommendation:           - Discharge patient to home (ambulatory).                           - Resume previous diet.                           - Await pathology results.                           - Repeat colonoscopy in  10 years for screening                            purposes.                           - Take Imodium as needed if more than 4 bowel                            movements per day Procedure Code(s):        --- Professional ---                           [phone], Colonoscopy, flexible; with biopsy, single                            or multiple Diagnosis Code(s):        --- Professional ---                           R19.7, Diarrhea, unspecified CPT copyright 2022 [employer]. All rights reserved. The codes documented in this report are preliminary and upon coder review may  be revised to meet current compliance  requirements. Maylon Peppers, MD Maylon Peppers,  07/01/2022 1:00:58 PM This report has been signed electronically. Number of Addenda: 0

## 2022-07-02 NOTE — Anesthesia Postprocedure Evaluation (Signed)
Anesthesia Post Note  Patient: Steve Rivera  Procedure(s) Performed: COLONOSCOPY WITH PROPOFOL ESOPHAGOGASTRODUODENOSCOPY (EGD) WITH PROPOFOL ESOPHAGEAL DILATION  Patient location during evaluation: Phase II Anesthesia Type: General Level of consciousness: awake Pain management: pain level controlled Vital Signs Assessment: post-procedure vital signs reviewed and stable Respiratory status: spontaneous breathing and respiratory function stable Cardiovascular status: blood pressure returned to baseline and stable Postop Assessment: no headache and no apparent nausea or vomiting Anesthetic complications: no Comments: Late entry   No notable events documented.   Last Vitals:  Vitals:   07/01/22 1259 07/01/22 1300  BP: (!) 102/48 (!) 107/53  Pulse: (!) 54 (!) 55  Resp: 20 (!) 23  Temp:    SpO2: 100% 100%    Last Pain:  Vitals:   07/01/22 1300  TempSrc:   PainSc: 0-No pain                 Louann Sjogren

## 2022-07-04 ENCOUNTER — Encounter (INDEPENDENT_AMBULATORY_CARE_PROVIDER_SITE_OTHER): Payer: Self-pay | Admitting: *Deleted

## 2022-07-04 LAB — SURGICAL PATHOLOGY

## 2022-07-07 ENCOUNTER — Encounter (HOSPITAL_COMMUNITY): Payer: Self-pay | Admitting: Gastroenterology

## 2022-09-19 ENCOUNTER — Ambulatory Visit (INDEPENDENT_AMBULATORY_CARE_PROVIDER_SITE_OTHER): Payer: BC Managed Care – PPO | Admitting: Gastroenterology

## 2022-09-19 ENCOUNTER — Encounter (INDEPENDENT_AMBULATORY_CARE_PROVIDER_SITE_OTHER): Payer: Self-pay | Admitting: Gastroenterology

## 2024-03-20 ENCOUNTER — Encounter (INDEPENDENT_AMBULATORY_CARE_PROVIDER_SITE_OTHER): Payer: Self-pay | Admitting: Gastroenterology
